# Patient Record
Sex: Male | Born: 1960 | Race: White | Hispanic: No | Marital: Married
Health system: Southern US, Community
[De-identification: ages and names within clinical notes are randomized; demographics above are authoritative.]

---

## 2000-10-21 ENCOUNTER — Encounter: Payer: Self-pay | Admitting: *Deleted

## 2000-10-21 ENCOUNTER — Emergency Department (HOSPITAL_COMMUNITY): Admission: EM | Admit: 2000-10-21 | Discharge: 2000-10-21 | Payer: Self-pay | Admitting: *Deleted

## 2003-02-07 ENCOUNTER — Ambulatory Visit (HOSPITAL_COMMUNITY): Admission: RE | Admit: 2003-02-07 | Discharge: 2003-02-07 | Payer: Self-pay | Admitting: Urology

## 2004-12-16 ENCOUNTER — Ambulatory Visit: Payer: Self-pay | Admitting: Orthopedic Surgery

## 2004-12-17 ENCOUNTER — Inpatient Hospital Stay (HOSPITAL_COMMUNITY): Admission: EM | Admit: 2004-12-17 | Discharge: 2004-12-19 | Payer: Self-pay | Admitting: Emergency Medicine

## 2004-12-22 ENCOUNTER — Ambulatory Visit: Payer: Self-pay | Admitting: Orthopedic Surgery

## 2004-12-24 ENCOUNTER — Inpatient Hospital Stay (HOSPITAL_COMMUNITY): Admission: RE | Admit: 2004-12-24 | Discharge: 2004-12-29 | Payer: Self-pay | Admitting: Orthopedic Surgery

## 2004-12-24 ENCOUNTER — Ambulatory Visit: Payer: Self-pay | Admitting: Orthopedic Surgery

## 2004-12-29 ENCOUNTER — Encounter: Payer: Self-pay | Admitting: Orthopedic Surgery

## 2005-01-05 ENCOUNTER — Ambulatory Visit: Payer: Self-pay | Admitting: Orthopedic Surgery

## 2005-01-26 ENCOUNTER — Ambulatory Visit: Payer: Self-pay | Admitting: Orthopedic Surgery

## 2005-02-02 ENCOUNTER — Ambulatory Visit: Payer: Self-pay | Admitting: Orthopedic Surgery

## 2005-02-09 ENCOUNTER — Ambulatory Visit: Payer: Self-pay | Admitting: Orthopedic Surgery

## 2005-02-16 ENCOUNTER — Encounter (HOSPITAL_COMMUNITY): Admission: RE | Admit: 2005-02-16 | Discharge: 2005-03-18 | Payer: Self-pay | Admitting: Orthopedic Surgery

## 2005-02-23 ENCOUNTER — Ambulatory Visit: Payer: Self-pay | Admitting: Orthopedic Surgery

## 2005-03-21 ENCOUNTER — Encounter (HOSPITAL_COMMUNITY): Admission: RE | Admit: 2005-03-21 | Discharge: 2005-04-20 | Payer: Self-pay | Admitting: Orthopedic Surgery

## 2005-03-23 ENCOUNTER — Ambulatory Visit: Payer: Self-pay | Admitting: Orthopedic Surgery

## 2005-04-21 ENCOUNTER — Encounter (HOSPITAL_COMMUNITY): Admission: RE | Admit: 2005-04-21 | Discharge: 2005-05-21 | Payer: Self-pay | Admitting: Orthopedic Surgery

## 2005-04-27 ENCOUNTER — Ambulatory Visit: Payer: Self-pay | Admitting: Orthopedic Surgery

## 2005-05-23 ENCOUNTER — Encounter (HOSPITAL_COMMUNITY): Admission: RE | Admit: 2005-05-23 | Discharge: 2005-06-22 | Payer: Self-pay | Admitting: Orthopedic Surgery

## 2005-06-27 ENCOUNTER — Ambulatory Visit: Payer: Self-pay | Admitting: Orthopedic Surgery

## 2005-08-10 ENCOUNTER — Ambulatory Visit: Payer: Self-pay | Admitting: Orthopedic Surgery

## 2006-02-09 ENCOUNTER — Ambulatory Visit: Payer: Self-pay | Admitting: Orthopedic Surgery
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
## 2006-02-22 IMAGING — RF DG TIBIA/FIBULA 2V*L*
1 series · 5 of 5 positions shown · non-contrast
Comparison: none

HISTORY: ORIF left tibial plateau fracture

[Series 1: run · 5 of 5 slices shown]
[im 1/5]
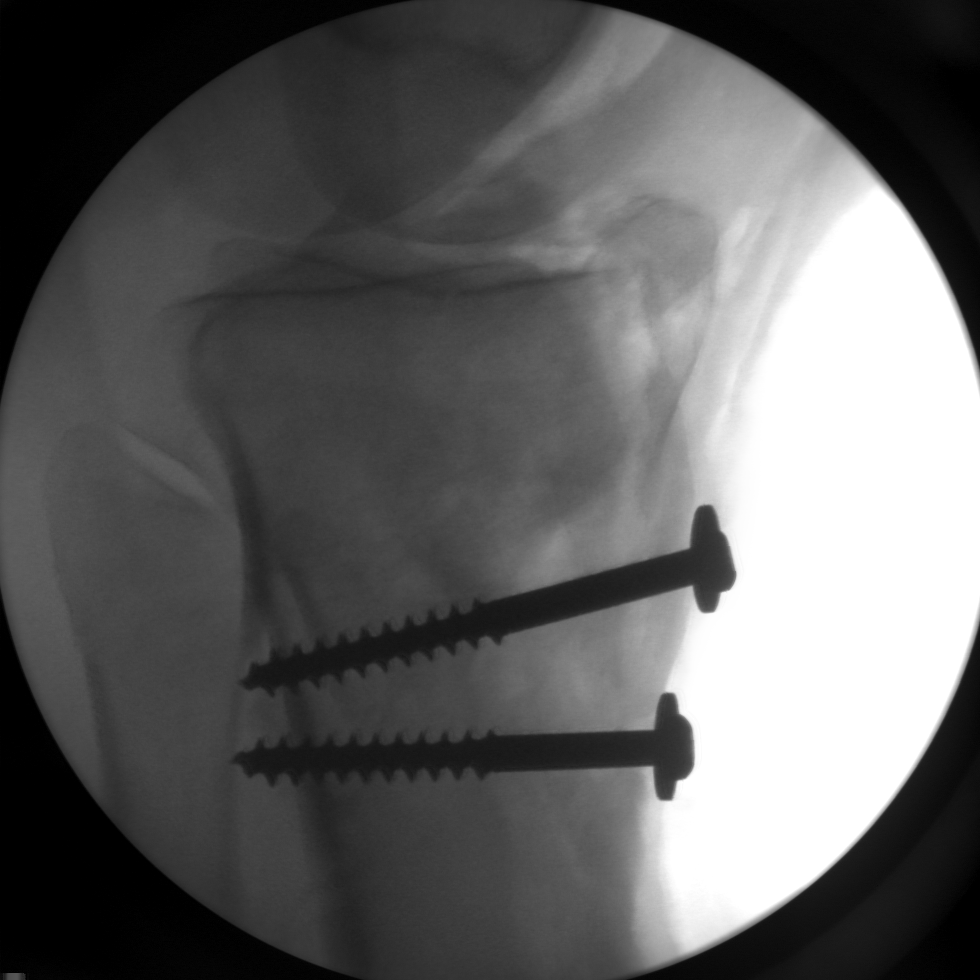
[im 2/5]
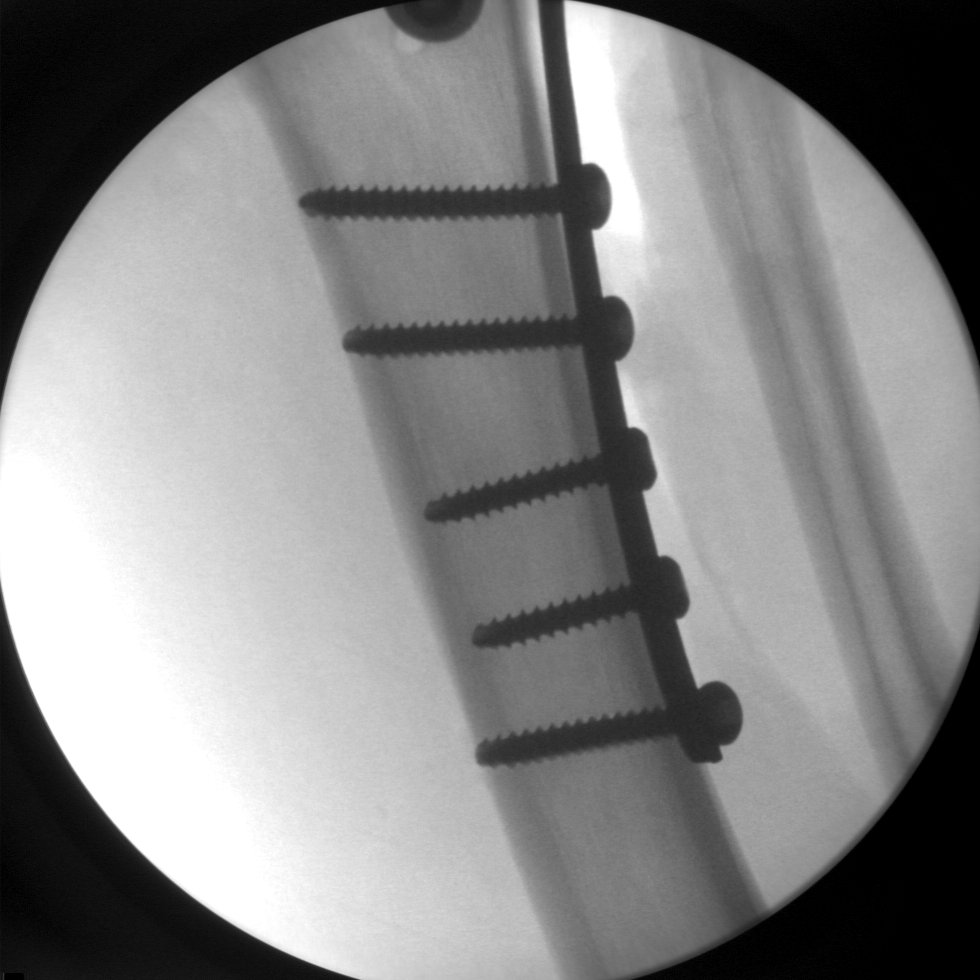
[im 3/5]
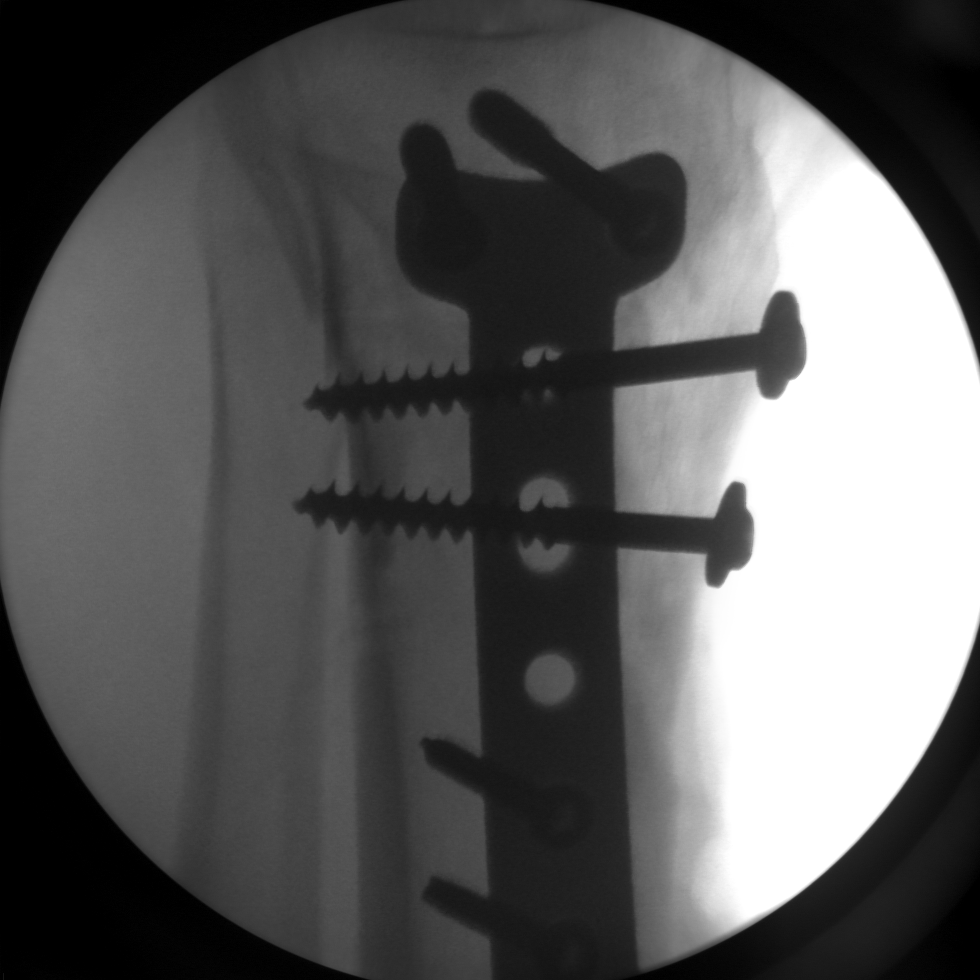
[im 4/5]
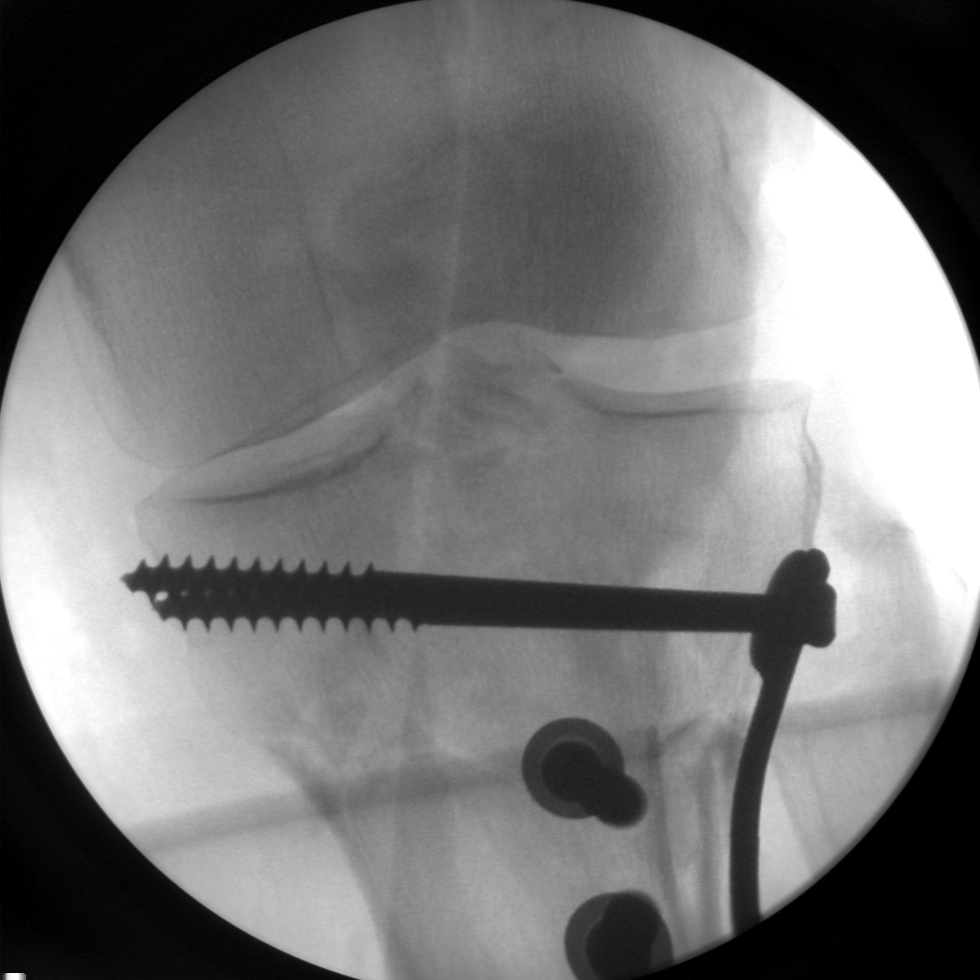
[im 5/5]
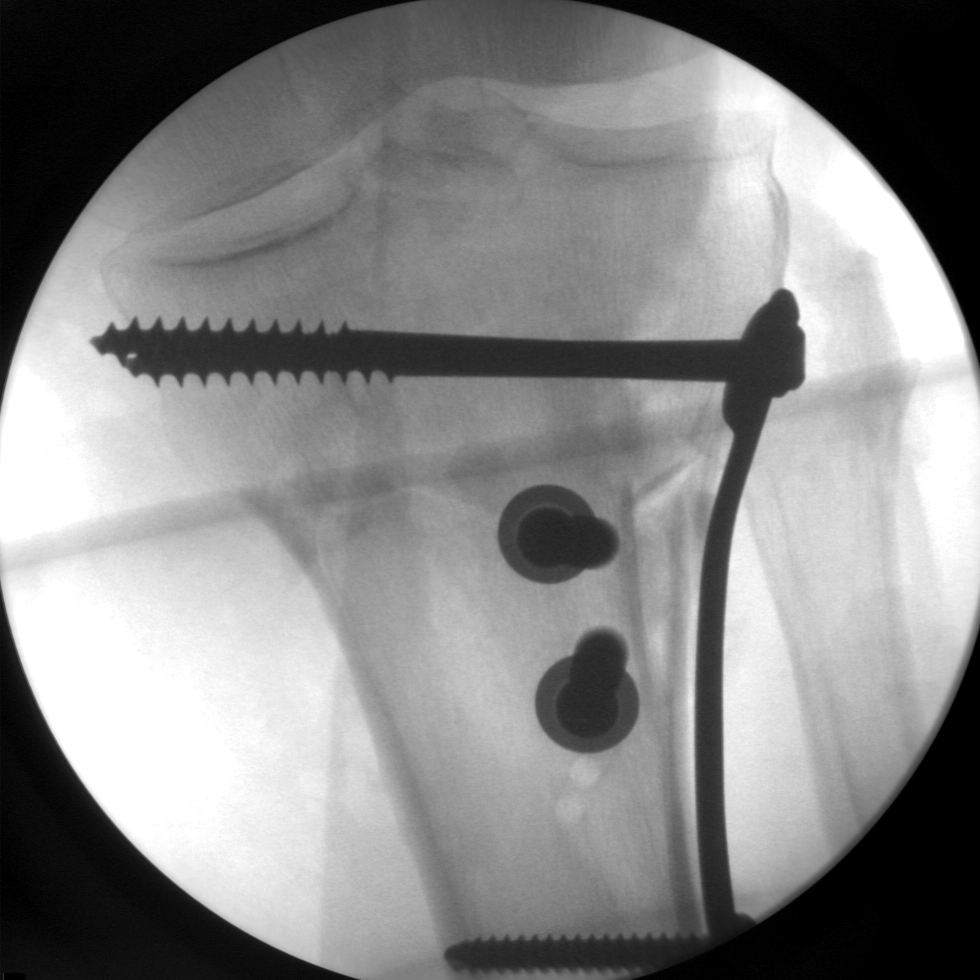

[5 of 5 positions shown; findings below may reference images not displayed]

LEFT TIBIA AND FIBULA 5 VIEWS:

Total of 5 digital C-arm fluoroscopic images obtained intraoperatively during
ORIF tibial fracture.
Two screws are seen anterior-posterior at level of tibial tubercle and
metaphysis.
Lateral buttress plate and 7 screws present.
Fracture appears reduced.
No intra-articular extension of fracture planes.
IMPRESSION: Status post ORIF proximal left tibial fracture.

## 2008-01-21 ENCOUNTER — Ambulatory Visit: Payer: Self-pay | Admitting: Orthopedic Surgery

## 2008-01-21 DIAGNOSIS — S82109A Unspecified fracture of upper end of unspecified tibia, initial encounter for closed fracture: Secondary | ICD-10-CM | POA: Insufficient documentation

## 2008-01-21 DIAGNOSIS — M1710 Unilateral primary osteoarthritis, unspecified knee: Secondary | ICD-10-CM

## 2008-01-25 ENCOUNTER — Telehealth: Payer: Self-pay | Admitting: Orthopedic Surgery

## 2008-03-04 ENCOUNTER — Telehealth: Payer: Self-pay | Admitting: Orthopedic Surgery

## 2009-02-26 ENCOUNTER — Ambulatory Visit: Payer: Self-pay | Admitting: Orthopedic Surgery

## 2009-02-26 DIAGNOSIS — M7580 Other shoulder lesions, unspecified shoulder: Secondary | ICD-10-CM

## 2009-02-26 DIAGNOSIS — M25519 Pain in unspecified shoulder: Secondary | ICD-10-CM

## 2010-02-16 NOTE — Assessment & Plan Note (Signed)
Summary: shoulder pain/needs xray/uhc/frs   Visit Type:  new problem  CC:  left shoulder pain.  History of Present Illness: I saw Wyatt Stone in the office today for a followup visit.  He is a 50 years old man with the complaint of:  NEW PROBLEM. LEFT SHOULDER PAIN.  Mr. Wyatt Stone presents with LEFT shoulder pain for without any history of injury is sharp, dull, throbbing, stabbing, burning in his hasn't and 9/10. It occurs all day, better with ice, worse when he lies on it. He does report some associated bruising, tingling, locking and swelling.  Patient states he has left shoulder pain for 5 months, no injury. He has taken Ibuprofen for pain, helps a little. He states that his shoulder aches like a toothache. He says that it hurts in shoulder, no radiation.  MEDICATIONS: Ibuprofen, Metformin, Glipizide, Welchol.  Allergies: No Known Drug Allergies  Past History:  Past Medical History: Last updated: 01/21/2008 stomach surgery diabetic  Past Surgical History: Last updated: 01/21/2008 12/24/04 left tibial plateau fx, otif with allograft bone croutons, left tibial plateau, Romeo Apple  Family History: Last updated: 01/21/2008 Family History of Diabetes  Social History: Last updated: 02/26/2009 Patient is married.  retired disabled  Does not smoke, does not drink.  Educational level: grade 10  Risk Factors: Caffeine Use: 0 (01/21/2008)  Risk Factors: Smoking Status: never (01/21/2008)  Social History: Patient is married.  retired disabled  Does not smoke, does not drink.  Educational level: grade 10  Review of Systems MS:  Complains of joint pain; denies rheumatoid arthritis, joint swelling, gout, bone cancer, osteoporosis, and ; muscle pain..  The review of systems is negative for General, Cardiac , Resp, GI, GU, Neuro, Endo, Psych, Derm, EENT, Immunology, and Lymphatic.  Physical Exam  Msk:  no deformity or scoliosis noted with normal posture and  gait Pulses:  pulses normal  Extremities:  no clubbing, cyanosis, edema, or deformity noted  Neurologic:  The coordination and sensation were normal  The reflexes were normal   Skin:  intact without lesions or rashes Cervical Nodes:  no significant adenopathy Psych:  alert and cooperative; normal mood and affect; normal attention span and concentration   Shoulder/Elbow Exam  Shoulder Exam:    Right:    Inspection:  Normal    Palpation:  Normal    Stability:  stable    Tenderness:  no    Swelling:  no    Erythema:  no    Range of Motion:       Flexion-Active: 180       Extension-Active: 45       Flexion-Passive: 180       Extension-Passive: 45       External Rotation : 45       Interior Rotation : T7    Left:    Inspection:  Abnormal    Palpation:  Abnormal       Location:  anterolateral deltoid    Stability:  stable    Tenderness:  anterolateral deltoid    Swelling:  no    Erythema:  no    Range of Motion:       Flexion-Active: 180       Extension-Active: 45       Flexion-Passive: 180       Extension-Passive: 45       External Rotation : 45       Interior Rotation : T7  Impingement Sign NEER:    Right negative; Left positive Impingement  Sign HAWKINS:    Right negative; Left negative Apprehension Sign:    Right negative; Left negative Sulcus Sign:    Right negative; Left negative AC joint Adduction Test:    Right negative; Left negative   Impression & Recommendations:  Problem # 1:  IMPINGEMENT SYNDROME (ICD-726.2) Assessment New  2 views of the glenohumeral joint   The glenohumeral joint space is normal. The bony anatomy is without bone lesion. The acromion is a Type 2  Impression normal glenohumeral joint with type II acromion.  Recommend LEFT shoulder subacromial injection Verbal consent obtained/The shoulder was injected with depomedrol 40mg /cc and sensorcaine .25% . There were no complications  Orders: Est. Patient Level IV (09811) Joint  Aspirate / Injection, Large (20610) Depo- Medrol 40mg  (J1030) Shoulder x-ray,  minimum 2 views (91478)  Problem # 2:  SHOULDER PAIN (ICD-719.41) Assessment: New  Orders: Est. Patient Level IV (29562) Joint Aspirate / Injection, Large (20610) Depo- Medrol 40mg  (J1030) Shoulder x-ray,  minimum 2 views (13086)  Patient Instructions: 1)  Please schedule a follow-up appointment as needed. 2)  You have received an injection of cortisone today. You may experience increased pain at the injection site. Apply ice pack to the area for 20 minutes every 2 hours and take 2 xtra strength tylenol every 8 hours. This increased pain will usually resolve in 24 hours. The injection will take effect in 3-10 days.  3)  Limit activity to comfort and avoid activities that increase discomfort.  Apply moist heat and/or ice to shoulder and take medication as instructed for pain relief. Please read the Shoulder Pain Handout and start exercises as directed.

## 2010-02-16 NOTE — Letter (Signed)
Summary: History form  History form   Imported By: Jacklynn Ganong 03/05/2009 08:20:40  _____________________________________________________________________  External Attachment:    Type:   Image     Comment:   External Document

## 2010-06-04 NOTE — Group Therapy Note (Signed)
Wyatt Stone, Wyatt Stone              ACCOUNT NO.:  [id number]   MEDICAL RECORD NO.:  [id number]          PATIENT TYPE:  INP   LOCATION:  A340                          FACILITY:  [facility]   PHYSICIAN:  Edward L. Juanetta Gosling, M.D.DATE OF BIRTH:  September 28, 1960   DATE OF PROCEDURE:  12/18/2004  DATE OF DISCHARGE:                                   PROGRESS NOTE   PROBLEM:  Status post fractured tibia.   SUBJECTIVE:  Wyatt Stone says he is better.  He has no new complaints.   ASSESSMENT/PLAN:  He is wanting to be set up for going home, but he is  concerned that he is taking IV pain medications now.  I discussed all this  with him and told him that I think at this point we should go ahead and  switch him to oral medications.  I am going to make arrangements for home  health.  Make arrangements for him to have a walker, a hospital bed and  bedside commode and I think he will probably be able to be discharged  tomorrow provided that he tolerates medications by mouth. He understands the  plan and agrees and I will plan to follow up with plans for discharge.      Edward L. Juanetta Gosling, M.D.  Electronically Signed     ELH/MEDQ  D:  12/18/2004  T:  12/18/2004  Job:  [id number]

## 2010-06-04 NOTE — Discharge Summary (Signed)
Wyatt Stone, Wyatt Stone              ACCOUNT NO.:  [id number]   MEDICAL RECORD NO.:  [id number]          PATIENT TYPE:  INP   LOCATION:  A323                          FACILITY:  [facility]   PHYSICIAN:  Vickki Hearing, M.D.DATE OF BIRTH:  Mar 14, 1960   DATE OF ADMISSION:  12/24/2004  DATE OF DISCHARGE:  12/13/2006LH                                 DISCHARGE SUMMARY   ADMISSION DIAGNOSIS:  Closed left tibial plateau bicondylar fracture.   DISCHARGE DIAGNOSIS:  Closed left tibial plateau bicondylar fracture.   PROCEDURES:  1.  On December 8, he underwent open treatment internal fixation of the left      proximal tibia plateau fracture with allograft bone chips.  Tolerated      that well.  However, the wound was not able to be closed due to      swelling.  He was kept in the hospital for elevation of the limb, pain      control and on December 28, 2004, underwent dressing change, attempted      wound closure and application of long leg cast.   PHYSICIANS:  Admitting physician and surgeon was Dr. Romeo Apple.   HOSPITAL COURSE:  The patient tolerated the surgery well, and was able to  ambulate 35 feet, nonweightbearing on his left lower extremity with standby  assistance with a walker.  Pain was controlled with Percocet and a PCA pump  which he weaned himself from.  On discharge, he is afebrile.  He is alert  and awake.  He has no signs of compartment syndrome.  Good vascularity to  his foot.  Cast is intact.  No cast irritation.  His only problem right now  is constipation.  We will treat that as an outpatient with suppository and  Colace as well as Milk of Magnesia.   CONDITION ON DISCHARGE:  Improved.   DISCHARGE MEDICATIONS:  1.  Coumadin 2.5 mg once daily #30, to take for one month.  2.  Percocet 5/325 mg one q.4 h #90.  3.  Colace 100 mg one p.o. b.i.d. #60, no refill.  4.  Dulcolax suppository one per rectum p.r.n. daily #10.  5.  Flexeril 5 mg one q.6 h p.r.n. #60 with two  refills.   FOLLOWUP:  Follow up is scheduled for December 20.  Cast window x-rays and  dressing change and start wet-to-dry dressings for the distal portion of the  wound at that time.   The patient should be nonweightbearing for approximately 8-12 weeks,  depending on radiographs.      Vickki Hearing, M.D.  Electronically Signed     SEH/MEDQ  D:  12/29/2004  T:  12/29/2004  Job:  [id number]   cc:   Vickki Hearing, M.D.  Fax: [phone]
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
## 2010-06-04 NOTE — Op Note (Signed)
NAMEFREDRIC, SLABACH              ACCOUNT NO.:  [id number]   MEDICAL RECORD NO.:  [id number]          PATIENT TYPE:  INP   LOCATION:  A323                          FACILITY:  [facility]   PHYSICIAN:  Vickki Hearing, M.D.DATE OF BIRTH:  [birth year]   DATE OF PROCEDURE:  12/24/2004  DATE OF DISCHARGE:                                 OPERATIVE REPORT   PREOPERATIVE DIAGNOSIS:  Bicondylar fracture of left tibial plateau.   POSTOPERATIVE DIAGNOSIS:  Bicondylar fracture of left tibial plateau.   PROCEDURE:  Open treatment internal fixation with allograft bone croutons,  left tibial plateau.   SURGEON:  Dr. Romeo Apple. No assistants.   ANESTHETIC:  General.   OPERATIVE FINDINGS:  There was a bicondylar depressed fracture of the left  tibial plateau. The tibial tubercle was fractured from the shaft of the  tibia.   The patient was identified as Wyatt Stone. His left lower extremity was  marked as the surgical site. He was given preoperative antibiotics, taken to  the operating room for general anesthesia. His left leg was prepped and  draped in sterile technique.   Time-out was completed, and the tourniquet was elevated to 300 mmHg. A  longitudinal incision was made just lateral to the midline and taken down to  the superficial fascia. Full-thickness skin flaps were established, and a  lateral arthrotomy was performed. The knee was flexed, and a traction  radiograph was taken, first with the knee in extension, and good reduction  was obtained with just some metaphyseal discontinuity noted on the lateral  side. We first fixed the tibial tubercle to the remaining tibial shaft with  two 6.5 cancellus screws using a 3.2 drill bit, self-tapping screws. We then  did an arthrotomy and tagged the meniscus and elevated it to visualize the  lateral condylar reduction. We placed a lateral buttress plate using AO  technique. We used large frag 6.5 screws proximally, 4.5 screws, self-  tapping  cortical screws distally and got an excellent reduction. We put a  tension band figure-of-eight suture around the distal screw on the tibial  tubercle to relieve some of the tension on the screws and also to provide a  tension band effect. We flexed the knee up to 90 degrees and were able to  maintain our reduction including reduction of the tibial tubercle.   We added some bone graft, irrigated the knee, repaired the lateral meniscus  and closed with 0 Monocryl in layered fashion. The distal 2-3 cm of the  wound was not closed due to excessive swelling. Tourniquet was released. A  Hemovac drain was placed, and the patient was extubated and taken to  recovery room with sterile dressings on and a knee immobilizer. He will be  nonweightbearing for six weeks. He will come back in several days to have  his wound closed and/or skin grafted.      Vickki Hearing, M.D.  Electronically Signed     SEH/MEDQ  D:  12/24/2004  T:  12/25/2004  Job:  [id number]

## 2011-05-05 ENCOUNTER — Ambulatory Visit (INDEPENDENT_AMBULATORY_CARE_PROVIDER_SITE_OTHER): Payer: Medicare Other | Admitting: Otolaryngology

## 2011-05-05 DIAGNOSIS — R1312 Dysphagia, oropharyngeal phase: Secondary | ICD-10-CM

## 2011-05-05 DIAGNOSIS — R070 Pain in throat: Secondary | ICD-10-CM

## 2011-06-16 ENCOUNTER — Ambulatory Visit (INDEPENDENT_AMBULATORY_CARE_PROVIDER_SITE_OTHER): Payer: Medicare Other | Admitting: Otolaryngology

## 2011-06-16 DIAGNOSIS — R070 Pain in throat: Secondary | ICD-10-CM

## 2012-06-14 ENCOUNTER — Ambulatory Visit (INDEPENDENT_AMBULATORY_CARE_PROVIDER_SITE_OTHER): Payer: Medicare Other | Admitting: Otolaryngology

## 2012-06-14 DIAGNOSIS — H6980 Other specified disorders of Eustachian tube, unspecified ear: Secondary | ICD-10-CM

## 2012-06-14 DIAGNOSIS — H6120 Impacted cerumen, unspecified ear: Secondary | ICD-10-CM

## 2013-11-14 ENCOUNTER — Ambulatory Visit (INDEPENDENT_AMBULATORY_CARE_PROVIDER_SITE_OTHER): Payer: Medicare Other | Admitting: Otolaryngology

## 2013-11-14 DIAGNOSIS — H6983 Other specified disorders of Eustachian tube, bilateral: Secondary | ICD-10-CM

## 2014-05-12 DIAGNOSIS — E1165 Type 2 diabetes mellitus with hyperglycemia: Secondary | ICD-10-CM | POA: Diagnosis not present

## 2014-05-12 DIAGNOSIS — R7989 Other specified abnormal findings of blood chemistry: Secondary | ICD-10-CM | POA: Diagnosis not present

## 2014-05-12 DIAGNOSIS — Z0000 Encounter for general adult medical examination without abnormal findings: Secondary | ICD-10-CM | POA: Diagnosis not present

## 2014-05-12 DIAGNOSIS — E785 Hyperlipidemia, unspecified: Secondary | ICD-10-CM | POA: Diagnosis not present

## 2014-05-19 DIAGNOSIS — Z1211 Encounter for screening for malignant neoplasm of colon: Secondary | ICD-10-CM | POA: Diagnosis not present

## 2014-07-03 DIAGNOSIS — M719 Bursopathy, unspecified: Secondary | ICD-10-CM | POA: Diagnosis not present

## 2014-07-03 DIAGNOSIS — M25512 Pain in left shoulder: Secondary | ICD-10-CM | POA: Diagnosis not present

## 2014-08-12 DIAGNOSIS — E1165 Type 2 diabetes mellitus with hyperglycemia: Secondary | ICD-10-CM | POA: Diagnosis not present

## 2014-08-12 DIAGNOSIS — M545 Low back pain: Secondary | ICD-10-CM | POA: Diagnosis not present

## 2014-08-12 DIAGNOSIS — R7989 Other specified abnormal findings of blood chemistry: Secondary | ICD-10-CM | POA: Diagnosis not present

## 2014-08-26 DIAGNOSIS — R7989 Other specified abnormal findings of blood chemistry: Secondary | ICD-10-CM | POA: Diagnosis not present

## 2014-08-26 DIAGNOSIS — M545 Low back pain: Secondary | ICD-10-CM | POA: Diagnosis not present

## 2014-08-26 DIAGNOSIS — E1165 Type 2 diabetes mellitus with hyperglycemia: Secondary | ICD-10-CM | POA: Diagnosis not present

## 2014-11-17 DIAGNOSIS — E119 Type 2 diabetes mellitus without complications: Secondary | ICD-10-CM | POA: Diagnosis not present

## 2014-11-17 DIAGNOSIS — Z23 Encounter for immunization: Secondary | ICD-10-CM | POA: Diagnosis not present

## 2014-11-17 DIAGNOSIS — J309 Allergic rhinitis, unspecified: Secondary | ICD-10-CM | POA: Diagnosis not present

## 2014-11-17 DIAGNOSIS — M25562 Pain in left knee: Secondary | ICD-10-CM | POA: Diagnosis not present

## 2014-11-17 DIAGNOSIS — M545 Low back pain: Secondary | ICD-10-CM | POA: Diagnosis not present

## 2015-04-09 DIAGNOSIS — E1165 Type 2 diabetes mellitus with hyperglycemia: Secondary | ICD-10-CM | POA: Diagnosis not present

## 2015-04-09 DIAGNOSIS — M545 Low back pain: Secondary | ICD-10-CM | POA: Diagnosis not present

## 2015-04-09 DIAGNOSIS — R7989 Other specified abnormal findings of blood chemistry: Secondary | ICD-10-CM | POA: Diagnosis not present

## 2015-04-16 DIAGNOSIS — R7989 Other specified abnormal findings of blood chemistry: Secondary | ICD-10-CM | POA: Diagnosis not present

## 2015-04-16 DIAGNOSIS — E1165 Type 2 diabetes mellitus with hyperglycemia: Secondary | ICD-10-CM | POA: Diagnosis not present

## 2015-04-16 DIAGNOSIS — M545 Low back pain: Secondary | ICD-10-CM | POA: Diagnosis not present

## 2015-05-22 DIAGNOSIS — Z79891 Long term (current) use of opiate analgesic: Secondary | ICD-10-CM | POA: Diagnosis not present

## 2015-07-13 DIAGNOSIS — E1165 Type 2 diabetes mellitus with hyperglycemia: Secondary | ICD-10-CM | POA: Diagnosis not present

## 2015-07-13 DIAGNOSIS — M545 Low back pain: Secondary | ICD-10-CM | POA: Diagnosis not present

## 2015-08-18 DIAGNOSIS — E1165 Type 2 diabetes mellitus with hyperglycemia: Secondary | ICD-10-CM | POA: Diagnosis not present

## 2015-08-18 DIAGNOSIS — M545 Low back pain: Secondary | ICD-10-CM | POA: Diagnosis not present

## 2016-01-28 DIAGNOSIS — M25562 Pain in left knee: Secondary | ICD-10-CM | POA: Diagnosis not present

## 2016-01-28 DIAGNOSIS — I10 Essential (primary) hypertension: Secondary | ICD-10-CM | POA: Diagnosis not present

## 2016-01-28 DIAGNOSIS — M545 Low back pain: Secondary | ICD-10-CM | POA: Diagnosis not present

## 2016-04-27 DIAGNOSIS — I10 Essential (primary) hypertension: Secondary | ICD-10-CM | POA: Diagnosis not present

## 2016-04-27 DIAGNOSIS — E1165 Type 2 diabetes mellitus with hyperglycemia: Secondary | ICD-10-CM | POA: Diagnosis not present

## 2016-04-27 DIAGNOSIS — M545 Low back pain: Secondary | ICD-10-CM | POA: Diagnosis not present

## 2016-06-01 DIAGNOSIS — M545 Low back pain: Secondary | ICD-10-CM | POA: Diagnosis not present

## 2016-06-01 DIAGNOSIS — I10 Essential (primary) hypertension: Secondary | ICD-10-CM | POA: Diagnosis not present

## 2016-06-01 DIAGNOSIS — E1165 Type 2 diabetes mellitus with hyperglycemia: Secondary | ICD-10-CM | POA: Diagnosis not present

## 2016-07-27 DIAGNOSIS — M25562 Pain in left knee: Secondary | ICD-10-CM | POA: Diagnosis not present

## 2016-07-27 DIAGNOSIS — I10 Essential (primary) hypertension: Secondary | ICD-10-CM | POA: Diagnosis not present

## 2016-07-27 DIAGNOSIS — M545 Low back pain: Secondary | ICD-10-CM | POA: Diagnosis not present

## 2016-11-01 DIAGNOSIS — E119 Type 2 diabetes mellitus without complications: Secondary | ICD-10-CM | POA: Diagnosis not present

## 2016-11-01 DIAGNOSIS — M545 Low back pain: Secondary | ICD-10-CM | POA: Diagnosis not present

## 2016-11-01 DIAGNOSIS — Z23 Encounter for immunization: Secondary | ICD-10-CM | POA: Diagnosis not present

## 2016-11-01 DIAGNOSIS — I10 Essential (primary) hypertension: Secondary | ICD-10-CM | POA: Diagnosis not present

## 2017-02-06 DIAGNOSIS — I10 Essential (primary) hypertension: Secondary | ICD-10-CM | POA: Diagnosis not present

## 2017-02-06 DIAGNOSIS — E785 Hyperlipidemia, unspecified: Secondary | ICD-10-CM | POA: Diagnosis not present

## 2017-02-06 DIAGNOSIS — M545 Low back pain: Secondary | ICD-10-CM | POA: Diagnosis not present

## 2017-02-06 DIAGNOSIS — E1165 Type 2 diabetes mellitus with hyperglycemia: Secondary | ICD-10-CM | POA: Diagnosis not present

## 2017-02-20 DIAGNOSIS — I10 Essential (primary) hypertension: Secondary | ICD-10-CM | POA: Diagnosis not present

## 2017-02-20 DIAGNOSIS — E1165 Type 2 diabetes mellitus with hyperglycemia: Secondary | ICD-10-CM | POA: Diagnosis not present

## 2017-02-20 DIAGNOSIS — E785 Hyperlipidemia, unspecified: Secondary | ICD-10-CM | POA: Diagnosis not present

## 2017-02-20 DIAGNOSIS — Z79891 Long term (current) use of opiate analgesic: Secondary | ICD-10-CM | POA: Diagnosis not present

## 2017-02-20 DIAGNOSIS — M545 Low back pain: Secondary | ICD-10-CM | POA: Diagnosis not present

## 2017-03-07 DIAGNOSIS — Z79891 Long term (current) use of opiate analgesic: Secondary | ICD-10-CM | POA: Diagnosis not present

## 2017-05-15 DIAGNOSIS — I129 Hypertensive chronic kidney disease with stage 1 through stage 4 chronic kidney disease, or unspecified chronic kidney disease: Secondary | ICD-10-CM | POA: Diagnosis not present

## 2017-05-15 DIAGNOSIS — E1121 Type 2 diabetes mellitus with diabetic nephropathy: Secondary | ICD-10-CM | POA: Diagnosis not present

## 2017-05-15 DIAGNOSIS — M5400 Panniculitis affecting regions of neck and back, site unspecified: Secondary | ICD-10-CM | POA: Diagnosis not present

## 2017-05-15 DIAGNOSIS — E1165 Type 2 diabetes mellitus with hyperglycemia: Secondary | ICD-10-CM | POA: Diagnosis not present

## 2017-06-06 DIAGNOSIS — E1165 Type 2 diabetes mellitus with hyperglycemia: Secondary | ICD-10-CM | POA: Diagnosis not present

## 2017-06-06 DIAGNOSIS — I129 Hypertensive chronic kidney disease with stage 1 through stage 4 chronic kidney disease, or unspecified chronic kidney disease: Secondary | ICD-10-CM | POA: Diagnosis not present

## 2017-06-06 DIAGNOSIS — E1121 Type 2 diabetes mellitus with diabetic nephropathy: Secondary | ICD-10-CM | POA: Diagnosis not present

## 2017-09-19 DIAGNOSIS — E1121 Type 2 diabetes mellitus with diabetic nephropathy: Secondary | ICD-10-CM | POA: Diagnosis not present

## 2017-09-19 DIAGNOSIS — M25562 Pain in left knee: Secondary | ICD-10-CM | POA: Diagnosis not present

## 2017-09-19 DIAGNOSIS — I129 Hypertensive chronic kidney disease with stage 1 through stage 4 chronic kidney disease, or unspecified chronic kidney disease: Secondary | ICD-10-CM | POA: Diagnosis not present

## 2017-09-19 DIAGNOSIS — M545 Low back pain: Secondary | ICD-10-CM | POA: Diagnosis not present

## 2017-10-06 DIAGNOSIS — M545 Low back pain: Secondary | ICD-10-CM | POA: Diagnosis not present

## 2017-10-06 DIAGNOSIS — M25562 Pain in left knee: Secondary | ICD-10-CM | POA: Diagnosis not present

## 2017-10-06 DIAGNOSIS — I129 Hypertensive chronic kidney disease with stage 1 through stage 4 chronic kidney disease, or unspecified chronic kidney disease: Secondary | ICD-10-CM | POA: Diagnosis not present

## 2017-10-06 DIAGNOSIS — E1121 Type 2 diabetes mellitus with diabetic nephropathy: Secondary | ICD-10-CM | POA: Diagnosis not present

## 2017-10-20 ENCOUNTER — Other Ambulatory Visit: Payer: Self-pay | Admitting: Pharmacist

## 2017-10-20 NOTE — Patient Outreach (Signed)
[place] [facility]) Care Management  10/20/2017  FRANKI STEMEN 1960/05/12 [id number]  Call to follow up with patient's PCP office regarding patient's high metformin dose and concerns about increased risk of adverse effects. Patient's current metformin prescription is written for metformin 1000 mg - take 1 tablet three times daily ('3000mg'$ /day). Note that the maximum recommended dose of metformin is 2550 mg/day. Will request patient's most recent eGFR, as with impaired renal function, even lower maximum doses are recommended (a maximum dose of 2 g/day has been recommended for patients with eGFR >45 to <60 mL/minute).  Leave a message on the voicemail of Becky in Dr. Luan Pulling' office. If have not heard back from the office by next week, will follow up again at that time.  Harlow Asa, PharmD, [place] [facility] [phone]

## 2017-10-20 NOTE — Patient Outreach (Signed)
[place] [facility]) Care Management  10/20/2017  TORRANCE STOCKLEY [birth year] [id number]   Incoming call from Kathrynn Speed in response to the [facility] Medication Adherence Campaign. Speak with patient. HIPAA identifiers verified and verbal consent received.  Mr. Hussar reports that he takes metformin 1000 mg three times daily and glipizide 10 mg once daily as directed. Reports that he had briefly run out of his glipizide, but that he just had it refilled a couple of weeks ago for a 90 day supply.  Mr. Armond denies any medication questions/concerns at this time.  Call patient's [facility] and speak with [facility]. Kenney Houseman confirms that Mr. Allbaugh last had the glipizide refilled on 10/08/17 for a 90 day supply. Reports that this prescription was run through the patient's insurance, but that the pharmacy has not been running his metformin through insurance because their cash price is less expensive for the patient.   Kenney Houseman confirms that the patient's current metformin prescription is written for metformin 1000 mg - take 1 tablet THREE times daily (3000mg /day). Note that the maximum recommended dose of metformin is 2550 mg/day. With impaired renal function, even lower maximum doses are recommended.   PLAN  Will call to follow up with patient's PCP office regarding patient's high metformin dose and concerns about increased risk of adverse effects.  Harlow Asa, PharmD, [place] [facility] [phone]

## 2017-11-13 ENCOUNTER — Other Ambulatory Visit: Payer: Self-pay | Admitting: Pharmacist

## 2017-11-13 NOTE — Patient Outreach (Signed)
[place] [facility]) Care Management  11/13/2017  JABARRI STEFANELLI May 24, 1960 [id number]  Call again to follow up with patient's PCP office regarding patient's high metformin dose and concerns about increased risk of adverse effects. Patient's current metformin prescription is written for metformin 1000 mg - take 1 tablet three times daily ('3000mg'$ /day). Note that the maximum recommended dose of metformin is 2550 mg/day.   Speak with Jacqlyn Larsen in Dr. Luan Pulling' office. Jacqlyn Larsen reports that patient's most recent eGFR was 51 mg/dL in September. Let Jacqlyn Larsen know that a maximum dose of 2 g/day has been recommended for patients with eGFR >45 to <60 mL/minute).  Request a call back once she has spoken with the provider.  If have not heard back from the office by 11/15/17, will follow up again at that time.  Harlow Asa, PharmD, [place] [facility] [phone]

## 2017-11-15 ENCOUNTER — Other Ambulatory Visit: Payer: Self-pay | Admitting: Pharmacist

## 2017-11-15 NOTE — Patient Outreach (Signed)
[place] [facility]) Care Management  11/15/2017  Wyatt Stone July 21, 1960 [id number]   Call again to follow up with patient's PCP office regarding recommendation that patient's metformin dose be reduced due to increased risk of adverse effects. Leave a message on the voicemail requesting a call back.  Harlow Asa, PharmD, [place] [facility] [phone]

## 2017-11-15 NOTE — Patient Outreach (Signed)
[facility]) Care Management  11/15/2017  Wyatt Stone 1960/04/24 [id number]   Receive a call back from [place] in Dr. Luan Pulling office letting me know that the provider agreed to my recommendation to reduce the patient's current metformin dose, adjusting the dose to metformin 1000 mg twice daily. Request that this new prescription be called into the patient's pharmacy. Joy states that she will ask the provider to do this. Also request that the patient be called and notified of this change.  Will close pharmacy episode at this time.  Harlow Asa, PharmD, [place] [facility] [phone]

## 2017-12-19 DIAGNOSIS — Z0000 Encounter for general adult medical examination without abnormal findings: Secondary | ICD-10-CM | POA: Diagnosis not present

## 2018-01-29 DIAGNOSIS — Z1211 Encounter for screening for malignant neoplasm of colon: Secondary | ICD-10-CM | POA: Diagnosis not present

## 2018-01-30 DIAGNOSIS — R7989 Other specified abnormal findings of blood chemistry: Secondary | ICD-10-CM | POA: Diagnosis not present

## 2018-01-30 DIAGNOSIS — E785 Hyperlipidemia, unspecified: Secondary | ICD-10-CM | POA: Diagnosis not present

## 2018-01-30 DIAGNOSIS — E1165 Type 2 diabetes mellitus with hyperglycemia: Secondary | ICD-10-CM | POA: Diagnosis not present

## 2018-01-30 DIAGNOSIS — I10 Essential (primary) hypertension: Secondary | ICD-10-CM | POA: Diagnosis not present

## 2018-03-20 DIAGNOSIS — M545 Low back pain: Secondary | ICD-10-CM | POA: Diagnosis not present

## 2018-03-20 DIAGNOSIS — E1121 Type 2 diabetes mellitus with diabetic nephropathy: Secondary | ICD-10-CM | POA: Diagnosis not present

## 2018-04-26 DIAGNOSIS — I129 Hypertensive chronic kidney disease with stage 1 through stage 4 chronic kidney disease, or unspecified chronic kidney disease: Secondary | ICD-10-CM | POA: Diagnosis not present

## 2018-04-26 DIAGNOSIS — E1121 Type 2 diabetes mellitus with diabetic nephropathy: Secondary | ICD-10-CM | POA: Diagnosis not present

## 2018-04-26 DIAGNOSIS — M545 Low back pain: Secondary | ICD-10-CM | POA: Diagnosis not present

## 2018-06-20 DIAGNOSIS — M25562 Pain in left knee: Secondary | ICD-10-CM | POA: Diagnosis not present

## 2018-06-20 DIAGNOSIS — E1165 Type 2 diabetes mellitus with hyperglycemia: Secondary | ICD-10-CM | POA: Diagnosis not present

## 2018-06-20 DIAGNOSIS — M545 Low back pain: Secondary | ICD-10-CM | POA: Diagnosis not present

## 2018-08-21 DIAGNOSIS — M25562 Pain in left knee: Secondary | ICD-10-CM | POA: Diagnosis not present

## 2018-08-21 DIAGNOSIS — I129 Hypertensive chronic kidney disease with stage 1 through stage 4 chronic kidney disease, or unspecified chronic kidney disease: Secondary | ICD-10-CM | POA: Diagnosis not present

## 2018-08-21 DIAGNOSIS — E1121 Type 2 diabetes mellitus with diabetic nephropathy: Secondary | ICD-10-CM | POA: Diagnosis not present

## 2018-11-21 DIAGNOSIS — E1121 Type 2 diabetes mellitus with diabetic nephropathy: Secondary | ICD-10-CM | POA: Diagnosis not present

## 2018-11-21 DIAGNOSIS — Z23 Encounter for immunization: Secondary | ICD-10-CM | POA: Diagnosis not present

## 2018-11-21 DIAGNOSIS — I129 Hypertensive chronic kidney disease with stage 1 through stage 4 chronic kidney disease, or unspecified chronic kidney disease: Secondary | ICD-10-CM | POA: Diagnosis not present

## 2018-11-21 DIAGNOSIS — M545 Low back pain: Secondary | ICD-10-CM | POA: Diagnosis not present

## 2019-01-07 DIAGNOSIS — Z0189 Encounter for other specified special examinations: Secondary | ICD-10-CM | POA: Diagnosis not present

## 2019-02-18 DIAGNOSIS — E1165 Type 2 diabetes mellitus with hyperglycemia: Secondary | ICD-10-CM | POA: Diagnosis not present

## 2019-02-18 DIAGNOSIS — E785 Hyperlipidemia, unspecified: Secondary | ICD-10-CM | POA: Diagnosis not present

## 2019-03-12 DIAGNOSIS — N1831 Chronic kidney disease, stage 3a: Secondary | ICD-10-CM | POA: Diagnosis not present

## 2019-03-12 DIAGNOSIS — E785 Hyperlipidemia, unspecified: Secondary | ICD-10-CM | POA: Diagnosis not present

## 2019-03-12 DIAGNOSIS — M130 Polyarthritis, unspecified: Secondary | ICD-10-CM | POA: Diagnosis not present

## 2019-03-12 DIAGNOSIS — E1165 Type 2 diabetes mellitus with hyperglycemia: Secondary | ICD-10-CM | POA: Diagnosis not present

## 2019-03-20 DIAGNOSIS — I10 Essential (primary) hypertension: Secondary | ICD-10-CM | POA: Diagnosis not present

## 2019-06-07 DIAGNOSIS — E1169 Type 2 diabetes mellitus with other specified complication: Secondary | ICD-10-CM | POA: Diagnosis not present

## 2019-06-07 DIAGNOSIS — E559 Vitamin D deficiency, unspecified: Secondary | ICD-10-CM | POA: Diagnosis not present

## 2019-06-07 DIAGNOSIS — E039 Hypothyroidism, unspecified: Secondary | ICD-10-CM | POA: Diagnosis not present

## 2019-06-07 DIAGNOSIS — E785 Hyperlipidemia, unspecified: Secondary | ICD-10-CM | POA: Diagnosis not present

## 2019-06-13 DIAGNOSIS — I10 Essential (primary) hypertension: Secondary | ICD-10-CM | POA: Diagnosis not present

## 2019-06-13 DIAGNOSIS — E785 Hyperlipidemia, unspecified: Secondary | ICD-10-CM | POA: Diagnosis not present

## 2019-06-13 DIAGNOSIS — N1831 Chronic kidney disease, stage 3a: Secondary | ICD-10-CM | POA: Diagnosis not present

## 2019-06-13 DIAGNOSIS — M130 Polyarthritis, unspecified: Secondary | ICD-10-CM | POA: Diagnosis not present

## 2019-06-13 DIAGNOSIS — E1165 Type 2 diabetes mellitus with hyperglycemia: Secondary | ICD-10-CM | POA: Diagnosis not present

## 2019-07-22 DIAGNOSIS — Z712 Person consulting for explanation of examination or test findings: Secondary | ICD-10-CM | POA: Diagnosis not present

## 2019-07-22 DIAGNOSIS — R944 Abnormal results of kidney function studies: Secondary | ICD-10-CM | POA: Diagnosis not present

## 2019-07-22 DIAGNOSIS — R1010 Upper abdominal pain, unspecified: Secondary | ICD-10-CM | POA: Diagnosis not present

## 2019-07-29 DIAGNOSIS — R1010 Upper abdominal pain, unspecified: Secondary | ICD-10-CM | POA: Diagnosis not present

## 2019-09-06 DIAGNOSIS — Z712 Person consulting for explanation of examination or test findings: Secondary | ICD-10-CM | POA: Diagnosis not present

## 2019-09-06 DIAGNOSIS — R7301 Impaired fasting glucose: Secondary | ICD-10-CM | POA: Diagnosis not present

## 2019-09-06 DIAGNOSIS — E785 Hyperlipidemia, unspecified: Secondary | ICD-10-CM | POA: Diagnosis not present

## 2019-09-12 DIAGNOSIS — I10 Essential (primary) hypertension: Secondary | ICD-10-CM | POA: Diagnosis not present

## 2019-09-12 DIAGNOSIS — M130 Polyarthritis, unspecified: Secondary | ICD-10-CM | POA: Diagnosis not present

## 2019-09-12 DIAGNOSIS — E785 Hyperlipidemia, unspecified: Secondary | ICD-10-CM | POA: Diagnosis not present

## 2019-09-12 DIAGNOSIS — N1831 Chronic kidney disease, stage 3a: Secondary | ICD-10-CM | POA: Diagnosis not present

## 2019-09-12 DIAGNOSIS — E1165 Type 2 diabetes mellitus with hyperglycemia: Secondary | ICD-10-CM | POA: Diagnosis not present

## 2019-09-16 DIAGNOSIS — I10 Essential (primary) hypertension: Secondary | ICD-10-CM | POA: Diagnosis not present

## 2019-09-16 DIAGNOSIS — K219 Gastro-esophageal reflux disease without esophagitis: Secondary | ICD-10-CM | POA: Diagnosis not present

## 2019-09-16 DIAGNOSIS — E7849 Other hyperlipidemia: Secondary | ICD-10-CM | POA: Diagnosis not present

## 2019-09-25 DIAGNOSIS — E7849 Other hyperlipidemia: Secondary | ICD-10-CM | POA: Diagnosis not present

## 2019-09-25 DIAGNOSIS — K219 Gastro-esophageal reflux disease without esophagitis: Secondary | ICD-10-CM | POA: Diagnosis not present

## 2019-09-25 DIAGNOSIS — I10 Essential (primary) hypertension: Secondary | ICD-10-CM | POA: Diagnosis not present

## 2019-11-15 DIAGNOSIS — E7849 Other hyperlipidemia: Secondary | ICD-10-CM | POA: Diagnosis not present

## 2019-11-15 DIAGNOSIS — K219 Gastro-esophageal reflux disease without esophagitis: Secondary | ICD-10-CM | POA: Diagnosis not present

## 2019-11-15 DIAGNOSIS — I10 Essential (primary) hypertension: Secondary | ICD-10-CM | POA: Diagnosis not present

## 2019-11-22 DIAGNOSIS — E7849 Other hyperlipidemia: Secondary | ICD-10-CM | POA: Diagnosis not present

## 2019-11-22 DIAGNOSIS — I10 Essential (primary) hypertension: Secondary | ICD-10-CM | POA: Diagnosis not present

## 2019-11-22 DIAGNOSIS — K219 Gastro-esophageal reflux disease without esophagitis: Secondary | ICD-10-CM | POA: Diagnosis not present

## 2019-12-17 DIAGNOSIS — K219 Gastro-esophageal reflux disease without esophagitis: Secondary | ICD-10-CM | POA: Diagnosis not present

## 2019-12-17 DIAGNOSIS — Z712 Person consulting for explanation of examination or test findings: Secondary | ICD-10-CM | POA: Diagnosis not present

## 2019-12-17 DIAGNOSIS — I10 Essential (primary) hypertension: Secondary | ICD-10-CM | POA: Diagnosis not present

## 2019-12-17 DIAGNOSIS — E7849 Other hyperlipidemia: Secondary | ICD-10-CM | POA: Diagnosis not present

## 2019-12-17 DIAGNOSIS — E1165 Type 2 diabetes mellitus with hyperglycemia: Secondary | ICD-10-CM | POA: Diagnosis not present

## 2019-12-17 DIAGNOSIS — Z0001 Encounter for general adult medical examination with abnormal findings: Secondary | ICD-10-CM | POA: Diagnosis not present

## 2019-12-19 DIAGNOSIS — M130 Polyarthritis, unspecified: Secondary | ICD-10-CM | POA: Diagnosis not present

## 2019-12-19 DIAGNOSIS — E785 Hyperlipidemia, unspecified: Secondary | ICD-10-CM | POA: Diagnosis not present

## 2019-12-19 DIAGNOSIS — N1831 Chronic kidney disease, stage 3a: Secondary | ICD-10-CM | POA: Diagnosis not present

## 2019-12-19 DIAGNOSIS — E1165 Type 2 diabetes mellitus with hyperglycemia: Secondary | ICD-10-CM | POA: Diagnosis not present

## 2019-12-19 DIAGNOSIS — Z23 Encounter for immunization: Secondary | ICD-10-CM | POA: Diagnosis not present

## 2020-01-17 DIAGNOSIS — E7849 Other hyperlipidemia: Secondary | ICD-10-CM | POA: Diagnosis not present

## 2020-01-17 DIAGNOSIS — K219 Gastro-esophageal reflux disease without esophagitis: Secondary | ICD-10-CM | POA: Diagnosis not present

## 2020-01-17 DIAGNOSIS — I10 Essential (primary) hypertension: Secondary | ICD-10-CM | POA: Diagnosis not present

## 2020-02-15 DIAGNOSIS — K219 Gastro-esophageal reflux disease without esophagitis: Secondary | ICD-10-CM | POA: Diagnosis not present

## 2020-02-15 DIAGNOSIS — I10 Essential (primary) hypertension: Secondary | ICD-10-CM | POA: Diagnosis not present

## 2020-02-15 DIAGNOSIS — E7849 Other hyperlipidemia: Secondary | ICD-10-CM | POA: Diagnosis not present

## 2020-03-05 DIAGNOSIS — N1831 Chronic kidney disease, stage 3a: Secondary | ICD-10-CM | POA: Diagnosis not present

## 2020-03-05 DIAGNOSIS — D72829 Elevated white blood cell count, unspecified: Secondary | ICD-10-CM | POA: Diagnosis not present

## 2020-03-05 DIAGNOSIS — M130 Polyarthritis, unspecified: Secondary | ICD-10-CM | POA: Diagnosis not present

## 2020-03-05 DIAGNOSIS — I10 Essential (primary) hypertension: Secondary | ICD-10-CM | POA: Diagnosis not present

## 2020-03-05 DIAGNOSIS — E785 Hyperlipidemia, unspecified: Secondary | ICD-10-CM | POA: Diagnosis not present

## 2020-03-05 DIAGNOSIS — E1165 Type 2 diabetes mellitus with hyperglycemia: Secondary | ICD-10-CM | POA: Diagnosis not present

## 2020-03-05 DIAGNOSIS — B372 Candidiasis of skin and nail: Secondary | ICD-10-CM | POA: Diagnosis not present

## 2020-03-05 DIAGNOSIS — D649 Anemia, unspecified: Secondary | ICD-10-CM | POA: Diagnosis not present

## 2020-03-05 DIAGNOSIS — N3000 Acute cystitis without hematuria: Secondary | ICD-10-CM | POA: Diagnosis not present

## 2020-03-30 DIAGNOSIS — E785 Hyperlipidemia, unspecified: Secondary | ICD-10-CM | POA: Diagnosis not present

## 2020-03-30 DIAGNOSIS — E1165 Type 2 diabetes mellitus with hyperglycemia: Secondary | ICD-10-CM | POA: Diagnosis not present

## 2020-03-30 DIAGNOSIS — D72829 Elevated white blood cell count, unspecified: Secondary | ICD-10-CM | POA: Diagnosis not present

## 2020-03-30 DIAGNOSIS — N1831 Chronic kidney disease, stage 3a: Secondary | ICD-10-CM | POA: Diagnosis not present

## 2020-03-30 DIAGNOSIS — D649 Anemia, unspecified: Secondary | ICD-10-CM | POA: Diagnosis not present

## 2020-04-02 DIAGNOSIS — E785 Hyperlipidemia, unspecified: Secondary | ICD-10-CM | POA: Diagnosis not present

## 2020-04-02 DIAGNOSIS — D649 Anemia, unspecified: Secondary | ICD-10-CM | POA: Diagnosis not present

## 2020-04-02 DIAGNOSIS — R5382 Chronic fatigue, unspecified: Secondary | ICD-10-CM | POA: Diagnosis not present

## 2020-04-02 DIAGNOSIS — M130 Polyarthritis, unspecified: Secondary | ICD-10-CM | POA: Diagnosis not present

## 2020-04-02 DIAGNOSIS — E1165 Type 2 diabetes mellitus with hyperglycemia: Secondary | ICD-10-CM | POA: Diagnosis not present

## 2020-04-02 DIAGNOSIS — D72829 Elevated white blood cell count, unspecified: Secondary | ICD-10-CM | POA: Diagnosis not present

## 2020-04-02 DIAGNOSIS — I10 Essential (primary) hypertension: Secondary | ICD-10-CM | POA: Diagnosis not present

## 2020-04-02 DIAGNOSIS — N1831 Chronic kidney disease, stage 3a: Secondary | ICD-10-CM | POA: Diagnosis not present

## 2020-05-17 DIAGNOSIS — E1165 Type 2 diabetes mellitus with hyperglycemia: Secondary | ICD-10-CM | POA: Diagnosis not present

## 2020-05-17 DIAGNOSIS — K219 Gastro-esophageal reflux disease without esophagitis: Secondary | ICD-10-CM | POA: Diagnosis not present

## 2020-06-30 DIAGNOSIS — E1165 Type 2 diabetes mellitus with hyperglycemia: Secondary | ICD-10-CM | POA: Diagnosis not present

## 2020-06-30 DIAGNOSIS — K219 Gastro-esophageal reflux disease without esophagitis: Secondary | ICD-10-CM | POA: Diagnosis not present

## 2020-07-02 DIAGNOSIS — I10 Essential (primary) hypertension: Secondary | ICD-10-CM | POA: Diagnosis not present

## 2020-07-02 DIAGNOSIS — E1165 Type 2 diabetes mellitus with hyperglycemia: Secondary | ICD-10-CM | POA: Diagnosis not present

## 2020-07-09 DIAGNOSIS — G894 Chronic pain syndrome: Secondary | ICD-10-CM | POA: Diagnosis not present

## 2020-07-09 DIAGNOSIS — E785 Hyperlipidemia, unspecified: Secondary | ICD-10-CM | POA: Diagnosis not present

## 2020-07-09 DIAGNOSIS — E1165 Type 2 diabetes mellitus with hyperglycemia: Secondary | ICD-10-CM | POA: Diagnosis not present

## 2020-07-09 DIAGNOSIS — I10 Essential (primary) hypertension: Secondary | ICD-10-CM | POA: Diagnosis not present

## 2020-09-16 DIAGNOSIS — E1165 Type 2 diabetes mellitus with hyperglycemia: Secondary | ICD-10-CM | POA: Diagnosis not present

## 2020-09-16 DIAGNOSIS — K219 Gastro-esophageal reflux disease without esophagitis: Secondary | ICD-10-CM | POA: Diagnosis not present

## 2020-10-09 DIAGNOSIS — I10 Essential (primary) hypertension: Secondary | ICD-10-CM | POA: Diagnosis not present

## 2020-10-09 DIAGNOSIS — E1165 Type 2 diabetes mellitus with hyperglycemia: Secondary | ICD-10-CM | POA: Diagnosis not present

## 2020-10-15 DIAGNOSIS — R809 Proteinuria, unspecified: Secondary | ICD-10-CM | POA: Diagnosis not present

## 2020-10-15 DIAGNOSIS — G894 Chronic pain syndrome: Secondary | ICD-10-CM | POA: Diagnosis not present

## 2020-10-15 DIAGNOSIS — R7401 Elevation of levels of liver transaminase levels: Secondary | ICD-10-CM | POA: Diagnosis not present

## 2020-10-15 DIAGNOSIS — E1165 Type 2 diabetes mellitus with hyperglycemia: Secondary | ICD-10-CM | POA: Diagnosis not present

## 2020-10-15 DIAGNOSIS — N1831 Chronic kidney disease, stage 3a: Secondary | ICD-10-CM | POA: Diagnosis not present

## 2020-10-15 DIAGNOSIS — I10 Essential (primary) hypertension: Secondary | ICD-10-CM | POA: Diagnosis not present

## 2020-10-15 DIAGNOSIS — E785 Hyperlipidemia, unspecified: Secondary | ICD-10-CM | POA: Diagnosis not present

## 2020-10-16 DIAGNOSIS — K219 Gastro-esophageal reflux disease without esophagitis: Secondary | ICD-10-CM | POA: Diagnosis not present

## 2020-10-16 DIAGNOSIS — E1165 Type 2 diabetes mellitus with hyperglycemia: Secondary | ICD-10-CM | POA: Diagnosis not present

## 2020-12-16 DIAGNOSIS — I10 Essential (primary) hypertension: Secondary | ICD-10-CM | POA: Diagnosis not present

## 2020-12-16 DIAGNOSIS — E782 Mixed hyperlipidemia: Secondary | ICD-10-CM | POA: Diagnosis not present

## 2021-01-15 DIAGNOSIS — I10 Essential (primary) hypertension: Secondary | ICD-10-CM | POA: Diagnosis not present

## 2021-01-15 DIAGNOSIS — E782 Mixed hyperlipidemia: Secondary | ICD-10-CM | POA: Diagnosis not present

## 2021-01-21 DIAGNOSIS — I10 Essential (primary) hypertension: Secondary | ICD-10-CM | POA: Diagnosis not present

## 2021-01-21 DIAGNOSIS — E1165 Type 2 diabetes mellitus with hyperglycemia: Secondary | ICD-10-CM | POA: Diagnosis not present

## 2021-01-25 DIAGNOSIS — E1165 Type 2 diabetes mellitus with hyperglycemia: Secondary | ICD-10-CM | POA: Diagnosis not present

## 2021-01-25 DIAGNOSIS — R809 Proteinuria, unspecified: Secondary | ICD-10-CM | POA: Diagnosis not present

## 2021-01-25 DIAGNOSIS — R7401 Elevation of levels of liver transaminase levels: Secondary | ICD-10-CM | POA: Diagnosis not present

## 2021-01-25 DIAGNOSIS — I10 Essential (primary) hypertension: Secondary | ICD-10-CM | POA: Diagnosis not present

## 2021-01-25 DIAGNOSIS — J302 Other seasonal allergic rhinitis: Secondary | ICD-10-CM | POA: Diagnosis not present

## 2021-01-25 DIAGNOSIS — G894 Chronic pain syndrome: Secondary | ICD-10-CM | POA: Diagnosis not present

## 2021-01-25 DIAGNOSIS — E785 Hyperlipidemia, unspecified: Secondary | ICD-10-CM | POA: Diagnosis not present

## 2021-01-25 DIAGNOSIS — E875 Hyperkalemia: Secondary | ICD-10-CM | POA: Diagnosis not present

## 2021-01-25 DIAGNOSIS — N1831 Chronic kidney disease, stage 3a: Secondary | ICD-10-CM | POA: Diagnosis not present

## 2021-02-10 DIAGNOSIS — J302 Other seasonal allergic rhinitis: Secondary | ICD-10-CM | POA: Diagnosis not present

## 2021-02-10 DIAGNOSIS — I10 Essential (primary) hypertension: Secondary | ICD-10-CM | POA: Diagnosis not present

## 2021-02-10 DIAGNOSIS — L989 Disorder of the skin and subcutaneous tissue, unspecified: Secondary | ICD-10-CM | POA: Diagnosis not present

## 2021-02-10 DIAGNOSIS — Z0001 Encounter for general adult medical examination with abnormal findings: Secondary | ICD-10-CM | POA: Diagnosis not present

## 2021-03-01 DIAGNOSIS — L570 Actinic keratosis: Secondary | ICD-10-CM | POA: Diagnosis not present

## 2021-03-01 DIAGNOSIS — X32XXXA Exposure to sunlight, initial encounter: Secondary | ICD-10-CM | POA: Diagnosis not present

## 2021-03-01 DIAGNOSIS — C4442 Squamous cell carcinoma of skin of scalp and neck: Secondary | ICD-10-CM | POA: Diagnosis not present

## 2021-05-12 DIAGNOSIS — E1165 Type 2 diabetes mellitus with hyperglycemia: Secondary | ICD-10-CM | POA: Diagnosis not present

## 2021-05-12 DIAGNOSIS — I10 Essential (primary) hypertension: Secondary | ICD-10-CM | POA: Diagnosis not present

## 2021-05-19 DIAGNOSIS — E875 Hyperkalemia: Secondary | ICD-10-CM | POA: Diagnosis not present

## 2021-05-19 DIAGNOSIS — E1165 Type 2 diabetes mellitus with hyperglycemia: Secondary | ICD-10-CM | POA: Diagnosis not present

## 2021-05-19 DIAGNOSIS — N1831 Chronic kidney disease, stage 3a: Secondary | ICD-10-CM | POA: Diagnosis not present

## 2021-05-19 DIAGNOSIS — E785 Hyperlipidemia, unspecified: Secondary | ICD-10-CM | POA: Diagnosis not present

## 2021-05-19 DIAGNOSIS — R3912 Poor urinary stream: Secondary | ICD-10-CM | POA: Diagnosis not present

## 2021-05-19 DIAGNOSIS — I10 Essential (primary) hypertension: Secondary | ICD-10-CM | POA: Diagnosis not present

## 2021-05-19 DIAGNOSIS — R809 Proteinuria, unspecified: Secondary | ICD-10-CM | POA: Diagnosis not present

## 2021-05-19 DIAGNOSIS — G894 Chronic pain syndrome: Secondary | ICD-10-CM | POA: Diagnosis not present

## 2021-05-19 DIAGNOSIS — J302 Other seasonal allergic rhinitis: Secondary | ICD-10-CM | POA: Diagnosis not present

## 2021-05-19 DIAGNOSIS — R7401 Elevation of levels of liver transaminase levels: Secondary | ICD-10-CM | POA: Diagnosis not present

## 2021-08-19 DIAGNOSIS — R891 Abnormal level of hormones in specimens from other organs, systems and tissues: Secondary | ICD-10-CM | POA: Diagnosis not present

## 2021-08-19 DIAGNOSIS — E1165 Type 2 diabetes mellitus with hyperglycemia: Secondary | ICD-10-CM | POA: Diagnosis not present

## 2021-08-26 DIAGNOSIS — R809 Proteinuria, unspecified: Secondary | ICD-10-CM | POA: Diagnosis not present

## 2021-08-26 DIAGNOSIS — N1831 Chronic kidney disease, stage 3a: Secondary | ICD-10-CM | POA: Diagnosis not present

## 2021-08-26 DIAGNOSIS — R7401 Elevation of levels of liver transaminase levels: Secondary | ICD-10-CM | POA: Diagnosis not present

## 2021-08-26 DIAGNOSIS — E785 Hyperlipidemia, unspecified: Secondary | ICD-10-CM | POA: Diagnosis not present

## 2021-08-26 DIAGNOSIS — E1165 Type 2 diabetes mellitus with hyperglycemia: Secondary | ICD-10-CM | POA: Diagnosis not present

## 2021-08-26 DIAGNOSIS — I169 Hypertensive crisis, unspecified: Secondary | ICD-10-CM | POA: Diagnosis not present

## 2021-08-26 DIAGNOSIS — G894 Chronic pain syndrome: Secondary | ICD-10-CM | POA: Diagnosis not present

## 2021-08-26 DIAGNOSIS — I10 Essential (primary) hypertension: Secondary | ICD-10-CM | POA: Diagnosis not present

## 2021-08-26 DIAGNOSIS — R891 Abnormal level of hormones in specimens from other organs, systems and tissues: Secondary | ICD-10-CM | POA: Diagnosis not present

## 2021-09-09 DIAGNOSIS — I10 Essential (primary) hypertension: Secondary | ICD-10-CM | POA: Diagnosis not present

## 2021-09-15 DIAGNOSIS — E1165 Type 2 diabetes mellitus with hyperglycemia: Secondary | ICD-10-CM | POA: Diagnosis not present

## 2021-09-15 DIAGNOSIS — I129 Hypertensive chronic kidney disease with stage 1 through stage 4 chronic kidney disease, or unspecified chronic kidney disease: Secondary | ICD-10-CM | POA: Diagnosis not present

## 2021-09-15 DIAGNOSIS — N1831 Chronic kidney disease, stage 3a: Secondary | ICD-10-CM | POA: Diagnosis not present

## 2021-09-15 DIAGNOSIS — E785 Hyperlipidemia, unspecified: Secondary | ICD-10-CM | POA: Diagnosis not present

## 2021-09-16 DIAGNOSIS — I10 Essential (primary) hypertension: Secondary | ICD-10-CM | POA: Diagnosis not present

## 2021-12-02 DIAGNOSIS — E1165 Type 2 diabetes mellitus with hyperglycemia: Secondary | ICD-10-CM | POA: Diagnosis not present

## 2021-12-08 DIAGNOSIS — G894 Chronic pain syndrome: Secondary | ICD-10-CM | POA: Diagnosis not present

## 2021-12-08 DIAGNOSIS — E1165 Type 2 diabetes mellitus with hyperglycemia: Secondary | ICD-10-CM | POA: Diagnosis not present

## 2021-12-08 DIAGNOSIS — Z0000 Encounter for general adult medical examination without abnormal findings: Secondary | ICD-10-CM | POA: Diagnosis not present

## 2021-12-08 DIAGNOSIS — E785 Hyperlipidemia, unspecified: Secondary | ICD-10-CM | POA: Diagnosis not present

## 2021-12-08 DIAGNOSIS — N1831 Chronic kidney disease, stage 3a: Secondary | ICD-10-CM | POA: Diagnosis not present

## 2021-12-08 DIAGNOSIS — M67813 Other specified disorders of tendon, right shoulder: Secondary | ICD-10-CM | POA: Diagnosis not present

## 2021-12-08 DIAGNOSIS — I10 Essential (primary) hypertension: Secondary | ICD-10-CM | POA: Diagnosis not present

## 2021-12-08 DIAGNOSIS — R809 Proteinuria, unspecified: Secondary | ICD-10-CM | POA: Diagnosis not present

## 2021-12-08 DIAGNOSIS — Z23 Encounter for immunization: Secondary | ICD-10-CM | POA: Diagnosis not present

## 2021-12-08 DIAGNOSIS — R7401 Elevation of levels of liver transaminase levels: Secondary | ICD-10-CM | POA: Diagnosis not present

## 2022-02-22 DIAGNOSIS — Z79899 Other long term (current) drug therapy: Secondary | ICD-10-CM | POA: Diagnosis not present

## 2022-02-22 DIAGNOSIS — R7989 Other specified abnormal findings of blood chemistry: Secondary | ICD-10-CM | POA: Diagnosis not present

## 2022-02-22 DIAGNOSIS — K649 Unspecified hemorrhoids: Secondary | ICD-10-CM | POA: Diagnosis not present

## 2022-02-22 DIAGNOSIS — I10 Essential (primary) hypertension: Secondary | ICD-10-CM | POA: Diagnosis not present

## 2022-02-22 DIAGNOSIS — R891 Abnormal level of hormones in specimens from other organs, systems and tissues: Secondary | ICD-10-CM | POA: Diagnosis not present

## 2022-04-11 DIAGNOSIS — R891 Abnormal level of hormones in specimens from other organs, systems and tissues: Secondary | ICD-10-CM | POA: Diagnosis not present

## 2022-04-14 DIAGNOSIS — Z0000 Encounter for general adult medical examination without abnormal findings: Secondary | ICD-10-CM | POA: Diagnosis not present

## 2022-04-18 DIAGNOSIS — E1142 Type 2 diabetes mellitus with diabetic polyneuropathy: Secondary | ICD-10-CM | POA: Diagnosis not present

## 2022-04-18 DIAGNOSIS — I10 Essential (primary) hypertension: Secondary | ICD-10-CM | POA: Diagnosis not present

## 2022-04-18 DIAGNOSIS — R891 Abnormal level of hormones in specimens from other organs, systems and tissues: Secondary | ICD-10-CM | POA: Diagnosis not present

## 2022-04-18 DIAGNOSIS — I129 Hypertensive chronic kidney disease with stage 1 through stage 4 chronic kidney disease, or unspecified chronic kidney disease: Secondary | ICD-10-CM | POA: Diagnosis not present

## 2022-04-18 DIAGNOSIS — G894 Chronic pain syndrome: Secondary | ICD-10-CM | POA: Diagnosis not present

## 2022-04-18 DIAGNOSIS — E785 Hyperlipidemia, unspecified: Secondary | ICD-10-CM | POA: Diagnosis not present

## 2022-04-18 DIAGNOSIS — M67813 Other specified disorders of tendon, right shoulder: Secondary | ICD-10-CM | POA: Diagnosis not present

## 2022-04-18 DIAGNOSIS — E1122 Type 2 diabetes mellitus with diabetic chronic kidney disease: Secondary | ICD-10-CM | POA: Diagnosis not present

## 2022-04-18 DIAGNOSIS — K649 Unspecified hemorrhoids: Secondary | ICD-10-CM | POA: Diagnosis not present

## 2022-04-18 DIAGNOSIS — N1831 Chronic kidney disease, stage 3a: Secondary | ICD-10-CM | POA: Diagnosis not present

## 2022-04-18 DIAGNOSIS — E1165 Type 2 diabetes mellitus with hyperglycemia: Secondary | ICD-10-CM | POA: Diagnosis not present

## 2022-09-14 DIAGNOSIS — R891 Abnormal level of hormones in specimens from other organs, systems and tissues: Secondary | ICD-10-CM | POA: Diagnosis not present

## 2022-09-20 DIAGNOSIS — R891 Abnormal level of hormones in specimens from other organs, systems and tissues: Secondary | ICD-10-CM | POA: Diagnosis not present

## 2022-09-20 DIAGNOSIS — R809 Proteinuria, unspecified: Secondary | ICD-10-CM | POA: Diagnosis not present

## 2022-09-20 DIAGNOSIS — N1831 Chronic kidney disease, stage 3a: Secondary | ICD-10-CM | POA: Diagnosis not present

## 2022-09-20 DIAGNOSIS — I129 Hypertensive chronic kidney disease with stage 1 through stage 4 chronic kidney disease, or unspecified chronic kidney disease: Secondary | ICD-10-CM | POA: Diagnosis not present

## 2022-09-20 DIAGNOSIS — M67813 Other specified disorders of tendon, right shoulder: Secondary | ICD-10-CM | POA: Diagnosis not present

## 2022-09-20 DIAGNOSIS — K649 Unspecified hemorrhoids: Secondary | ICD-10-CM | POA: Diagnosis not present

## 2022-09-20 DIAGNOSIS — I10 Essential (primary) hypertension: Secondary | ICD-10-CM | POA: Diagnosis not present

## 2022-09-20 DIAGNOSIS — R7401 Elevation of levels of liver transaminase levels: Secondary | ICD-10-CM | POA: Diagnosis not present

## 2022-09-20 DIAGNOSIS — G894 Chronic pain syndrome: Secondary | ICD-10-CM | POA: Diagnosis not present

## 2022-09-20 DIAGNOSIS — E785 Hyperlipidemia, unspecified: Secondary | ICD-10-CM | POA: Diagnosis not present

## 2022-10-14 ENCOUNTER — Telehealth: Payer: Self-pay | Admitting: Pharmacist
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
## 2022-10-28 ENCOUNTER — Telehealth: Payer: Self-pay | Admitting: Pharmacy Technician

## 2022-10-28 DIAGNOSIS — Z5986 Financial insecurity: Secondary | ICD-10-CM

## 2022-10-28 NOTE — Progress Notes (Signed)
[facility] Customer service manager [facility])                                            [facility] Quality Pharmacy Team    10/28/2022  Wyatt Stone 06/12/1960 [id number]                                      Medication Assistance Referral  Referral From: [facility] Katina B.  Medication/Company: Leana Roe Patient application portion:  Mailed Provider application portion: Faxed  to Leone Payor, AGNP Provider address/fax verified via: Office website  Pattricia Boss, CPhT [place]  Office: [phone] Fax: [phone] Email: [email or web address]

## 2022-12-08 ENCOUNTER — Other Ambulatory Visit: Payer: Self-pay | Admitting: Pharmacy Technician

## 2022-12-08 DIAGNOSIS — Z5986 Financial insecurity: Secondary | ICD-10-CM

## 2022-12-08 NOTE — Progress Notes (Signed)
Pharmacy Medication Assistance Program Note    12/08/2022  Patient ID: Wyatt Stone, male   DOB: [birth year], 62 y.o.   MRN: [id number]     12/08/2022  Outreach Medication One  Manufacturer Medication One Retail buyer Drugs Trulicity  Type of Radiographer, therapeutic Assistance  Date Application Received From Patient 11/23/2022  Application Items Received From Patient Application;Proof of Income;Other  Date Application Received From Provider 10/31/2022  Date Application Submitted to Manufacturer 12/07/2022  Method Application Sent to Manufacturer Fax       Signature   Kristopher Glee [facility]  Office: [phone] Fax: [phone] Email: [email or web address]

## 2022-12-23 DIAGNOSIS — R7301 Impaired fasting glucose: Secondary | ICD-10-CM | POA: Diagnosis not present

## 2022-12-23 DIAGNOSIS — I10 Essential (primary) hypertension: Secondary | ICD-10-CM | POA: Diagnosis not present

## 2022-12-29 DIAGNOSIS — E1142 Type 2 diabetes mellitus with diabetic polyneuropathy: Secondary | ICD-10-CM | POA: Diagnosis not present

## 2022-12-29 DIAGNOSIS — R891 Abnormal level of hormones in specimens from other organs, systems and tissues: Secondary | ICD-10-CM | POA: Diagnosis not present

## 2022-12-29 DIAGNOSIS — Z23 Encounter for immunization: Secondary | ICD-10-CM | POA: Diagnosis not present

## 2022-12-29 DIAGNOSIS — E1165 Type 2 diabetes mellitus with hyperglycemia: Secondary | ICD-10-CM | POA: Diagnosis not present

## 2022-12-29 DIAGNOSIS — R809 Proteinuria, unspecified: Secondary | ICD-10-CM | POA: Diagnosis not present

## 2022-12-29 DIAGNOSIS — R7989 Other specified abnormal findings of blood chemistry: Secondary | ICD-10-CM | POA: Diagnosis not present

## 2022-12-29 DIAGNOSIS — R7401 Elevation of levels of liver transaminase levels: Secondary | ICD-10-CM | POA: Diagnosis not present

## 2022-12-29 DIAGNOSIS — I10 Essential (primary) hypertension: Secondary | ICD-10-CM | POA: Diagnosis not present

## 2022-12-29 DIAGNOSIS — E785 Hyperlipidemia, unspecified: Secondary | ICD-10-CM | POA: Diagnosis not present

## 2022-12-29 DIAGNOSIS — I129 Hypertensive chronic kidney disease with stage 1 through stage 4 chronic kidney disease, or unspecified chronic kidney disease: Secondary | ICD-10-CM | POA: Diagnosis not present

## 2022-12-29 DIAGNOSIS — G894 Chronic pain syndrome: Secondary | ICD-10-CM | POA: Diagnosis not present

## 2023-01-19 ENCOUNTER — Telehealth: Payer: Self-pay | Admitting: Pharmacy Technician

## 2023-01-19 DIAGNOSIS — Z5986 Financial insecurity: Secondary | ICD-10-CM

## 2023-01-19 NOTE — Progress Notes (Signed)
 Pharmacy Medication Assistance Program Note    01/19/2023  Patient ID: Wyatt Stone, male   DOB: 1960/02/09, 63 y.o.   MRN: [id number]     12/08/2022 01/19/2023  Outreach Medication One  Manufacturer Medication One [employer] Drugs Trulicity   Type of Radiographer, Therapeutic Assistance   Date Application Received From Patient 11/23/2022   Application Items Received From Patient Application;Proof of Income;Other   Date Application Received From Provider 10/31/2022   Date Application Submitted to Manufacturer 12/07/2022   Method Application Sent to Manufacturer Fax   Patient Assistance Determination  Approved  Approval Start Date  01/18/2023  Approval End Date  01/17/2024  Patient Notification Method  Telephone Call  Telephone Call Outcome  Successful    Care coordination call placed to [employer] in regard to Trulicity application. Spoke to Nellie who informs patient is APPROVED 01/18/23-01/17/24. Medication will auto fill and ship to patient's home address on file based on last fill date in 2024. Patient may call [employer] at any time to check on the next refill and shipment dates.  Successful outreach to patient. HIPAA verified. Patient informed of his approval and how to reorder his medication if the shipment does not automatically deliver in a timely manner. He was provided [employer]'s phone number to check on his shipments which is [phone].  Signature  Kate Marzette [facility]  Office: [phone] Fax: [phone] Email: [email or web address]

## 2023-04-06 DIAGNOSIS — R891 Abnormal level of hormones in specimens from other organs, systems and tissues: Secondary | ICD-10-CM | POA: Diagnosis not present

## 2023-04-06 DIAGNOSIS — R809 Proteinuria, unspecified: Secondary | ICD-10-CM | POA: Diagnosis not present

## 2023-04-12 DIAGNOSIS — I129 Hypertensive chronic kidney disease with stage 1 through stage 4 chronic kidney disease, or unspecified chronic kidney disease: Secondary | ICD-10-CM | POA: Diagnosis not present

## 2023-04-12 DIAGNOSIS — E785 Hyperlipidemia, unspecified: Secondary | ICD-10-CM | POA: Diagnosis not present

## 2023-04-12 DIAGNOSIS — R7989 Other specified abnormal findings of blood chemistry: Secondary | ICD-10-CM | POA: Diagnosis not present

## 2023-04-12 DIAGNOSIS — E1122 Type 2 diabetes mellitus with diabetic chronic kidney disease: Secondary | ICD-10-CM | POA: Diagnosis not present

## 2023-04-12 DIAGNOSIS — I10 Essential (primary) hypertension: Secondary | ICD-10-CM | POA: Diagnosis not present

## 2023-04-12 DIAGNOSIS — G894 Chronic pain syndrome: Secondary | ICD-10-CM | POA: Diagnosis not present

## 2023-04-12 DIAGNOSIS — E1142 Type 2 diabetes mellitus with diabetic polyneuropathy: Secondary | ICD-10-CM | POA: Diagnosis not present

## 2023-04-12 DIAGNOSIS — R7401 Elevation of levels of liver transaminase levels: Secondary | ICD-10-CM | POA: Diagnosis not present

## 2023-04-12 DIAGNOSIS — E1165 Type 2 diabetes mellitus with hyperglycemia: Secondary | ICD-10-CM | POA: Diagnosis not present

## 2023-04-12 DIAGNOSIS — N1831 Chronic kidney disease, stage 3a: Secondary | ICD-10-CM | POA: Diagnosis not present

## 2023-04-12 DIAGNOSIS — R809 Proteinuria, unspecified: Secondary | ICD-10-CM | POA: Diagnosis not present

## 2023-07-19 DIAGNOSIS — R891 Abnormal level of hormones in specimens from other organs, systems and tissues: Secondary | ICD-10-CM | POA: Diagnosis not present

## 2023-07-19 DIAGNOSIS — E1165 Type 2 diabetes mellitus with hyperglycemia: Secondary | ICD-10-CM | POA: Diagnosis not present

## 2023-07-19 DIAGNOSIS — I10 Essential (primary) hypertension: Secondary | ICD-10-CM | POA: Diagnosis not present

## 2023-07-26 DIAGNOSIS — N1831 Chronic kidney disease, stage 3a: Secondary | ICD-10-CM | POA: Diagnosis not present

## 2023-07-26 DIAGNOSIS — M67813 Other specified disorders of tendon, right shoulder: Secondary | ICD-10-CM | POA: Diagnosis not present

## 2023-07-26 DIAGNOSIS — I10 Essential (primary) hypertension: Secondary | ICD-10-CM | POA: Diagnosis not present

## 2023-07-26 DIAGNOSIS — E1142 Type 2 diabetes mellitus with diabetic polyneuropathy: Secondary | ICD-10-CM | POA: Diagnosis not present

## 2023-07-26 DIAGNOSIS — E785 Hyperlipidemia, unspecified: Secondary | ICD-10-CM | POA: Diagnosis not present

## 2023-07-26 DIAGNOSIS — I129 Hypertensive chronic kidney disease with stage 1 through stage 4 chronic kidney disease, or unspecified chronic kidney disease: Secondary | ICD-10-CM | POA: Diagnosis not present

## 2023-07-26 DIAGNOSIS — R809 Proteinuria, unspecified: Secondary | ICD-10-CM | POA: Diagnosis not present

## 2023-07-26 DIAGNOSIS — E1165 Type 2 diabetes mellitus with hyperglycemia: Secondary | ICD-10-CM | POA: Diagnosis not present

## 2023-07-26 DIAGNOSIS — G894 Chronic pain syndrome: Secondary | ICD-10-CM | POA: Diagnosis not present

## 2023-07-26 DIAGNOSIS — R891 Abnormal level of hormones in specimens from other organs, systems and tissues: Secondary | ICD-10-CM | POA: Diagnosis not present

## 2023-07-26 DIAGNOSIS — R7401 Elevation of levels of liver transaminase levels: Secondary | ICD-10-CM | POA: Diagnosis not present
